# Patient Record
Sex: Female | Born: 1965 | Race: White | Hispanic: No | Marital: Single | Smoking: Current every day smoker
Health system: Southern US, Community
[De-identification: ages and names within clinical notes are randomized; demographics above are authoritative.]

## PROBLEM LIST (undated history)

## (undated) HISTORY — PX: ABDOMINAL HYSTERECTOMY: SHX81

## (undated) HISTORY — PX: DILATION AND CURETTAGE OF UTERUS: SHX78

## (undated) HISTORY — PX: UTERINE FIBROID SURGERY: SHX826

---

## 2013-06-08 DIAGNOSIS — K219 Gastro-esophageal reflux disease without esophagitis: Secondary | ICD-10-CM | POA: Insufficient documentation

## 2013-10-24 DIAGNOSIS — M545 Low back pain, unspecified: Secondary | ICD-10-CM | POA: Insufficient documentation

## 2016-04-21 ENCOUNTER — Emergency Department (HOSPITAL_COMMUNITY)
Admission: EM | Admit: 2016-04-21 | Discharge: 2016-04-21 | Disposition: A | Discharge status: Home / Self Care | Payer: Self-pay | Attending: Emergency Medicine | Admitting: Emergency Medicine

## 2016-04-21 ENCOUNTER — Emergency Department (HOSPITAL_COMMUNITY): Payer: Self-pay

## 2016-04-21 ENCOUNTER — Encounter (HOSPITAL_COMMUNITY): Payer: Self-pay | Admitting: Emergency Medicine

## 2016-04-21 DIAGNOSIS — R0789 Other chest pain: Secondary | ICD-10-CM | POA: Insufficient documentation

## 2016-04-21 DIAGNOSIS — F17210 Nicotine dependence, cigarettes, uncomplicated: Secondary | ICD-10-CM | POA: Insufficient documentation

## 2016-04-21 LAB — CBC
HEMATOCRIT: 41.9 % (ref 36.0–46.0)
HEMOGLOBIN: 14.8 g/dL (ref 12.0–15.0)
MCH: 28.8 pg (ref 26.0–34.0)
MCHC: 35.3 g/dL (ref 30.0–36.0)
MCV: 81.5 fL (ref 78.0–100.0)
Platelets: 324 10*3/uL (ref 150–400)
RBC: 5.14 MIL/uL — AB (ref 3.87–5.11)
RDW: 13.6 % (ref 11.5–15.5)
WBC: 8.5 10*3/uL (ref 4.0–10.5)

## 2016-04-21 LAB — BASIC METABOLIC PANEL
ANION GAP: 8 (ref 5–15)
BUN: 13 mg/dL (ref 6–20)
CALCIUM: 9.2 mg/dL (ref 8.9–10.3)
CO2: 24 mmol/L (ref 22–32)
Chloride: 108 mmol/L (ref 101–111)
Creatinine, Ser: 0.59 mg/dL (ref 0.44–1.00)
GLUCOSE: 113 mg/dL — AB (ref 65–99)
POTASSIUM: 3.7 mmol/L (ref 3.5–5.1)
SODIUM: 140 mmol/L (ref 135–145)

## 2016-04-21 LAB — I-STAT TROPONIN, ED: TROPONIN I, POC: 0 ng/mL (ref 0.00–0.08)

## 2016-04-21 LAB — TROPONIN I

## 2016-04-21 LAB — D-DIMER, QUANTITATIVE (NOT AT ARMC): D DIMER QUANT: 0.47 ug{FEU}/mL (ref 0.00–0.50)

## 2016-04-21 NOTE — ED Triage Notes (Signed)
Pt c/o palpitations x weeks, central chest pain and left arm pain onset last night. Chronic SOB worsened over past year.

## 2016-04-21 NOTE — ED Notes (Signed)
ED Provider at bedside. 

## 2016-04-21 NOTE — ED Provider Notes (Addendum)
[facility]-EMERGENCY DEPT Provider Note   CSN: [id number] Arrival date & time: 04/21/16  1351     History   Chief Complaint Chief Complaint  Patient presents with  . Palpitations  . Shortness of Breath  . Chest Pain    HPI Jody Williams is a 51 y.o. female.  51 year old female presents with several week history of palpitations as well as a 6 month history of dyspnea on exertion. Denies any orthopnea. No cough or congestion. No fever or chills. Palpitations are described as heart beating fast denies any irregularity to it. She has had some central chest discomfort which last for minutes to hours and is positional. No exertional component to this. Denies any lower extremity edema. Thought that some of this could be due to anxiety but she denies any symptoms at this time. No suicidal or homicidal ideations. Denies any excessive caffeine use. No recent history of blood loss. Nothing makes her symptoms better. No treatment use prior to arrival.      History reviewed. No pertinent past medical history.  There are no active problems to display for this patient.   Past Surgical History:  Procedure Laterality Date  . ABDOMINAL HYSTERECTOMY     left oopharectomy, right ovary intact  . CESAREAN SECTION    . DILATION AND CURETTAGE OF UTERUS    . UTERINE FIBROID SURGERY      OB History    No data available       Home Medications    Prior to Admission medications   Not on File    Family History History reviewed. No pertinent family history.  Social History Social History  Substance Use Topics  . Smoking status: Current Every Day Smoker    Packs/day: 0.50    Types: Cigarettes  . Smokeless tobacco: Never Used  . Alcohol use No     Comment: occasional     Allergies   Contrast media [iodinated diagnostic agents]   Review of Systems Review of Systems  All other systems reviewed and are negative.    Physical Exam Updated Vital Signs BP 121/71   Pulse 67   Temp  98.9 F (37.2 C) (Oral)   Resp 16   SpO2 98%   Physical Exam  Constitutional: She is oriented to person, place, and time. She appears well-developed and well-nourished.  Non-toxic appearance. No distress.  HENT:  Head: Normocephalic and atraumatic.  Eyes: Conjunctivae, EOM and lids are normal. Pupils are equal, round, and reactive to light.  Neck: Normal range of motion. Neck supple. No tracheal deviation present. No thyroid mass present.  Cardiovascular: Normal rate, regular rhythm and normal heart sounds.  Exam reveals no gallop.   No murmur heard. Pulmonary/Chest: Effort normal and breath sounds normal. No stridor. No respiratory distress. She has no decreased breath sounds. She has no wheezes. She has no rhonchi. She has no rales.  Abdominal: Soft. Normal appearance and bowel sounds are normal. She exhibits no distension. There is no tenderness. There is no rebound and no CVA tenderness.  Musculoskeletal: Normal range of motion. She exhibits no edema or tenderness.  Neurological: She is alert and oriented to person, place, and time. She has normal strength. No cranial nerve deficit or sensory deficit. GCS eye subscore is 4. GCS verbal subscore is 5. GCS motor subscore is 6.  Skin: Skin is warm and dry. No abrasion and no rash noted.  Psychiatric: She has a normal mood and affect. Her speech is normal and behavior is normal.  Nursing note and vitals reviewed.    ED Treatments / Results  Labs (all labs ordered are listed, but only abnormal results are displayed) Labs Reviewed  BASIC METABOLIC PANEL - Abnormal; Notable for the following:       Result Value   Glucose, Bld 113 (*)    All other components within normal limits  CBC - Abnormal; Notable for the following:    RBC 5.14 (*)    All other components within normal limits  D-DIMER, QUANTITATIVE (NOT AT [facility])  TROPONIN I  I-STAT TROPOININ, ED    EKG  EKG Interpretation  Date/Time:  Monday April 21 2016 14:03:43  EDT Ventricular Rate:  81 PR Interval:    QRS Duration: 97 QT Interval:  382 QTC Calculation: 444 R Axis:   76 Text Interpretation:  Sinus rhythm RSR' in V1 or V2, right VCD or RVH Confirmed by Freida Busman  MD, [place] Kitner ([id number]) on 04/21/2016 6:43:45 PM       Radiology Dg Chest 2 View  Result Date: 04/21/2016 CLINICAL DATA:  Palpitation for several weeks. EXAM: CHEST  2 VIEW COMPARISON:  None. FINDINGS: The heart size and mediastinal contours are within normal limits. There is no focal infiltrate, pulmonary edema, or pleural effusion. The visualized skeletal structures are unremarkable. IMPRESSION: No active cardiopulmonary disease. Electronically Signed   By: Sherian Rein M.D.   On: 04/21/2016 14:47    Procedures Procedures (including critical care time)  Medications Ordered in ED Medications - No data to display   Initial Impression / Assessment and Plan / ED Course  I have reviewed the triage vital signs and the nursing notes.  Pertinent labs & imaging results that were available during my care of the patient were reviewed by me and considered in my medical decision making (see chart for details).     Patient with negative d-dimer as well as delta troponin that was negative. Symptoms are better for some time now. Pain is worse with palpation to her chest wall. Do not think this represents ACS. Return precautions given.Patient's heart score is 2.  Final Clinical Impressions(s) / ED Diagnoses   Final diagnoses:  None    New Prescriptions New Prescriptions   No medications on file     Lorre Nick, MD 04/21/16 2144    Lorre Nick, MD 04/21/16 2145

## 2017-12-29 ENCOUNTER — Ambulatory Visit: Payer: Self-pay | Admitting: Nurse Practitioner

## 2017-12-29 ENCOUNTER — Ambulatory Visit: Payer: Self-pay

## 2017-12-29 VITALS — BP 112/68 | HR 87 | Temp 98.2°F | Resp 16

## 2017-12-29 DIAGNOSIS — F17200 Nicotine dependence, unspecified, uncomplicated: Secondary | ICD-10-CM

## 2017-12-29 DIAGNOSIS — H6501 Acute serous otitis media, right ear: Secondary | ICD-10-CM

## 2017-12-29 DIAGNOSIS — J329 Chronic sinusitis, unspecified: Secondary | ICD-10-CM

## 2017-12-29 MED ORDER — DOXYCYCLINE HYCLATE 100 MG PO TABS: 100.0000 mg | ORAL_TABLET | Freq: Two times a day (BID) | ORAL | 0 refills | Status: AC

## 2017-12-29 MED ORDER — FLUTICASONE PROPIONATE 50 MCG/ACT NA SUSP: NASAL | 6 refills | Status: AC

## 2017-12-29 NOTE — Patient Instructions (Signed)
Fluids, rest and keep hands washed Take meds as directed Return to clinic if symptoms persist or worsen

## 2017-12-29 NOTE — Progress Notes (Signed)
   Subjective:    Patient ID: Jody Williams, female    DOB: 1965/04/05, 52 y.o.   MRN: [id number]  HPI  Jody Williams comes [facility] the [facility] with c/o headache, upper teeth pain and facial pain x 1 week. Also reports right ear pain. Does report she has allergies and allergie to a lot of foods. She takes daily zyrtec and claritin. Has been rubbing vicks over temporal area and chest. Reports was seen at the end of Oct and treated for bronchitis; which she was put on abx and albuterol inhaler. Denies any wheezing, SOB, fever or chest pain.   Admits smoking 5/6 cigarettes per day and has been a smoker x 30 years which she's trying to "cut back"  Noted e AOB was signed by patient and with IT issues it isn't displaying in documents. Verbal consent to see and treat given during OV. Review of Systems  Constitutional: Negative for fatigue and fever.  HENT: Positive for ear pain, sinus pressure and sinus pain. Negative for congestion, ear discharge and facial swelling.   Respiratory: Negative for chest tightness, shortness of breath and wheezing.   Cardiovascular: Negative for chest pain.  Neurological: Positive for headaches.       Objective:   Physical Exam  Constitutional: She is oriented to person, place, and time. She appears well-developed and well-nourished.  HENT:  Head: Normocephalic.   Right ear with moderate yellow cerumen but TM appears erythematous. Left TM with moderate yellow cerumen and unable to fully visualize TM. Tenderness to maxillary and frontal sinus. Mildly injected pharynx  Neck: Normal range of motion.  Cardiovascular: Normal rate, regular rhythm and normal heart sounds.  Pulmonary/Chest: Effort normal and breath sounds normal. No respiratory distress.  Abdominal: Soft. Bowel sounds are normal.  Musculoskeletal: Normal range of motion.  Lymphadenopathy:    She has cervical adenopathy.  Neurological: She is alert and oriented to person, place, and time.  Skin:  Skin is warm and dry.  Psychiatric: She has a normal mood and affect.  Vitals reviewed.         Assessment & Plan:

## 2018-01-14 ENCOUNTER — Telehealth: Payer: Self-pay | Admitting: Nurse Practitioner

## 2018-01-14 NOTE — Telephone Encounter (Signed)
Patient left message on app noting she's not improved and almost completed doxycyline with the same symptoms that she had in office. Reports flonase is working but temporarily and has done treatment as directed. Denies fever, reported NKDA, note contrast dye on allergy list, Advised to continue flonase, routine claritin and called Augmentin 875/125 mg PO BID x 7 days #14 (start 01/09/18) [facility]. To follow up if no improvement. Verbalized understanding.

## 2018-04-19 IMAGING — CR DG CHEST 2V
2 series · 2 of 2 positions shown · non-contrast
Comparison: None.

CLINICAL DATA: Palpitation for several weeks.

EXAM:
CHEST  2 VIEW

[w chest pa]
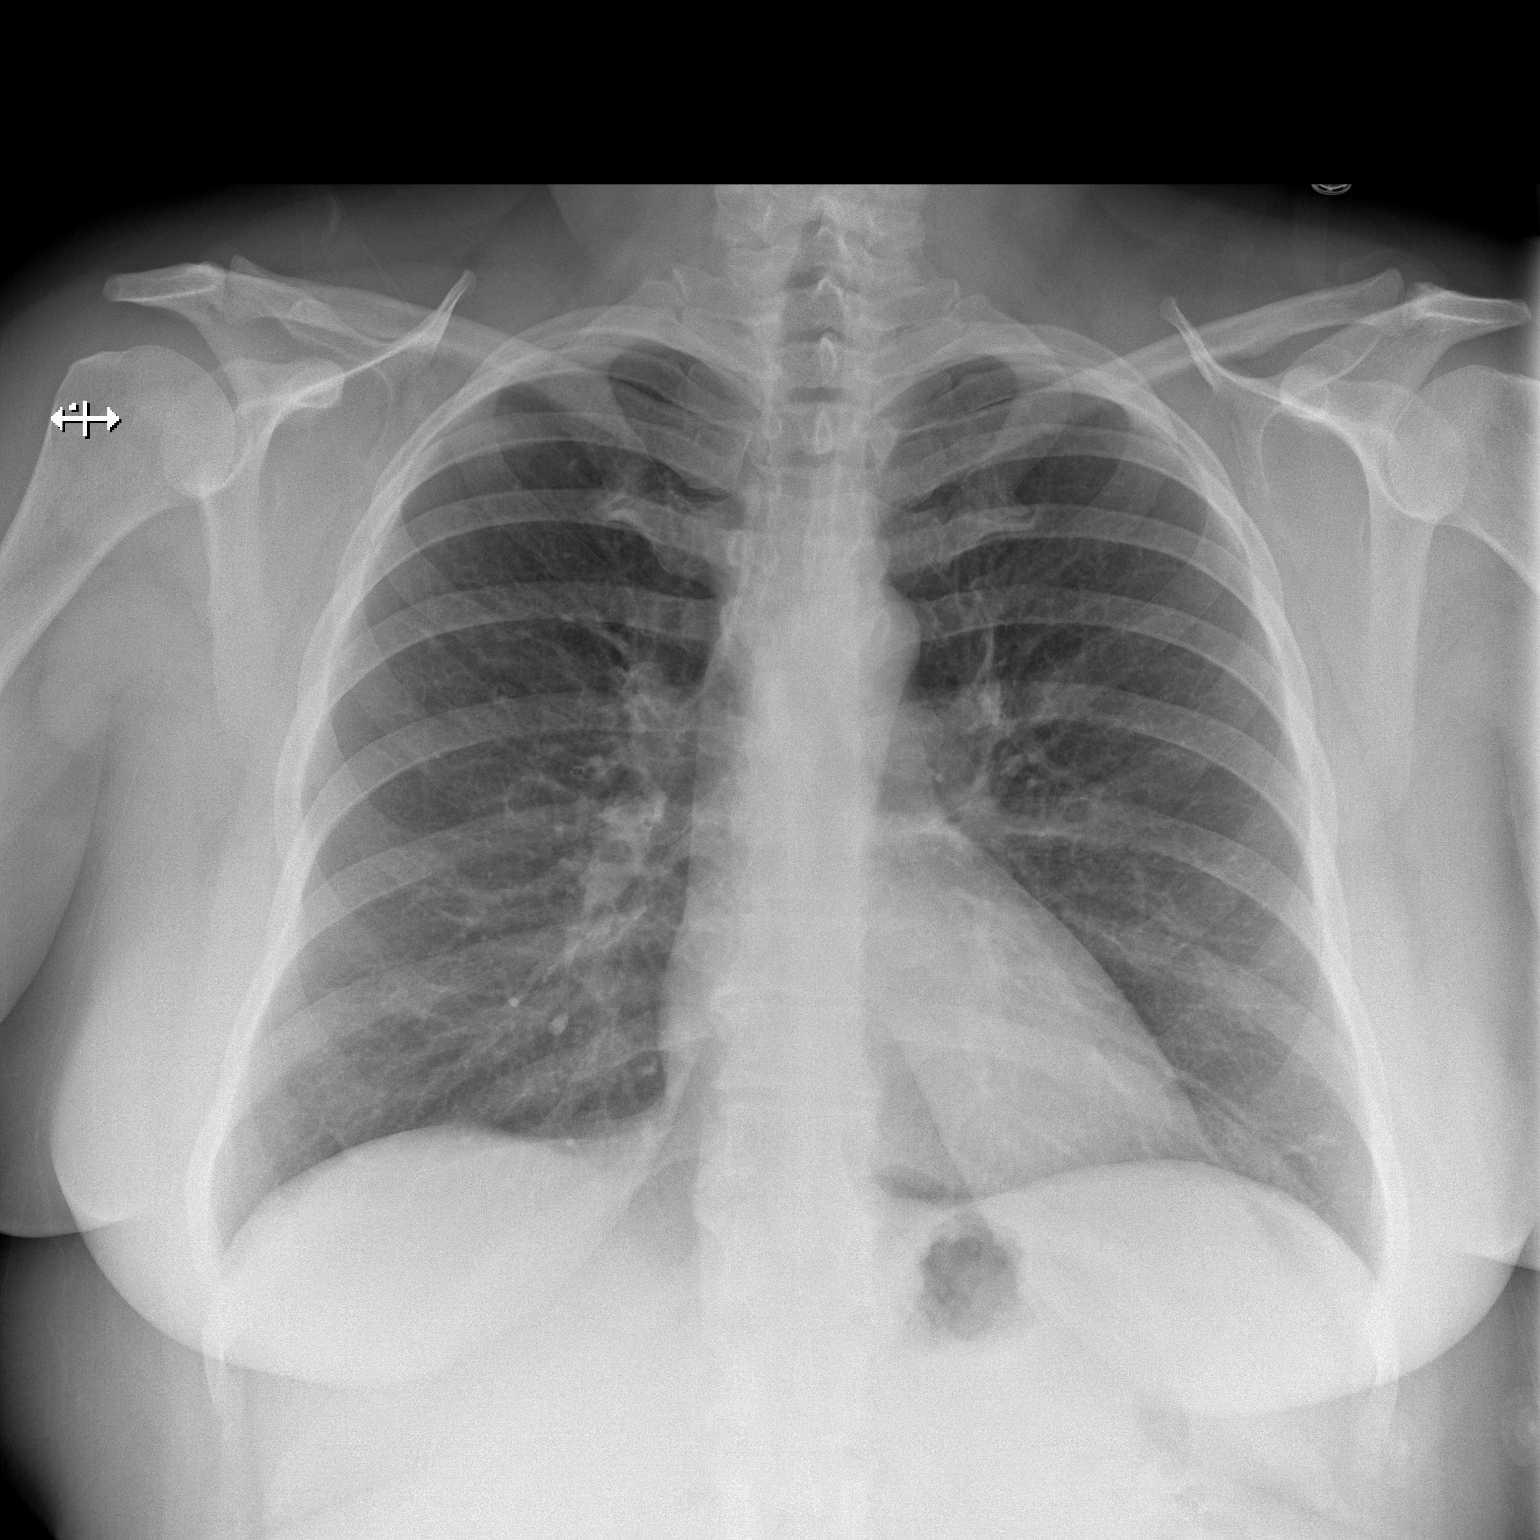

[w chest lat]
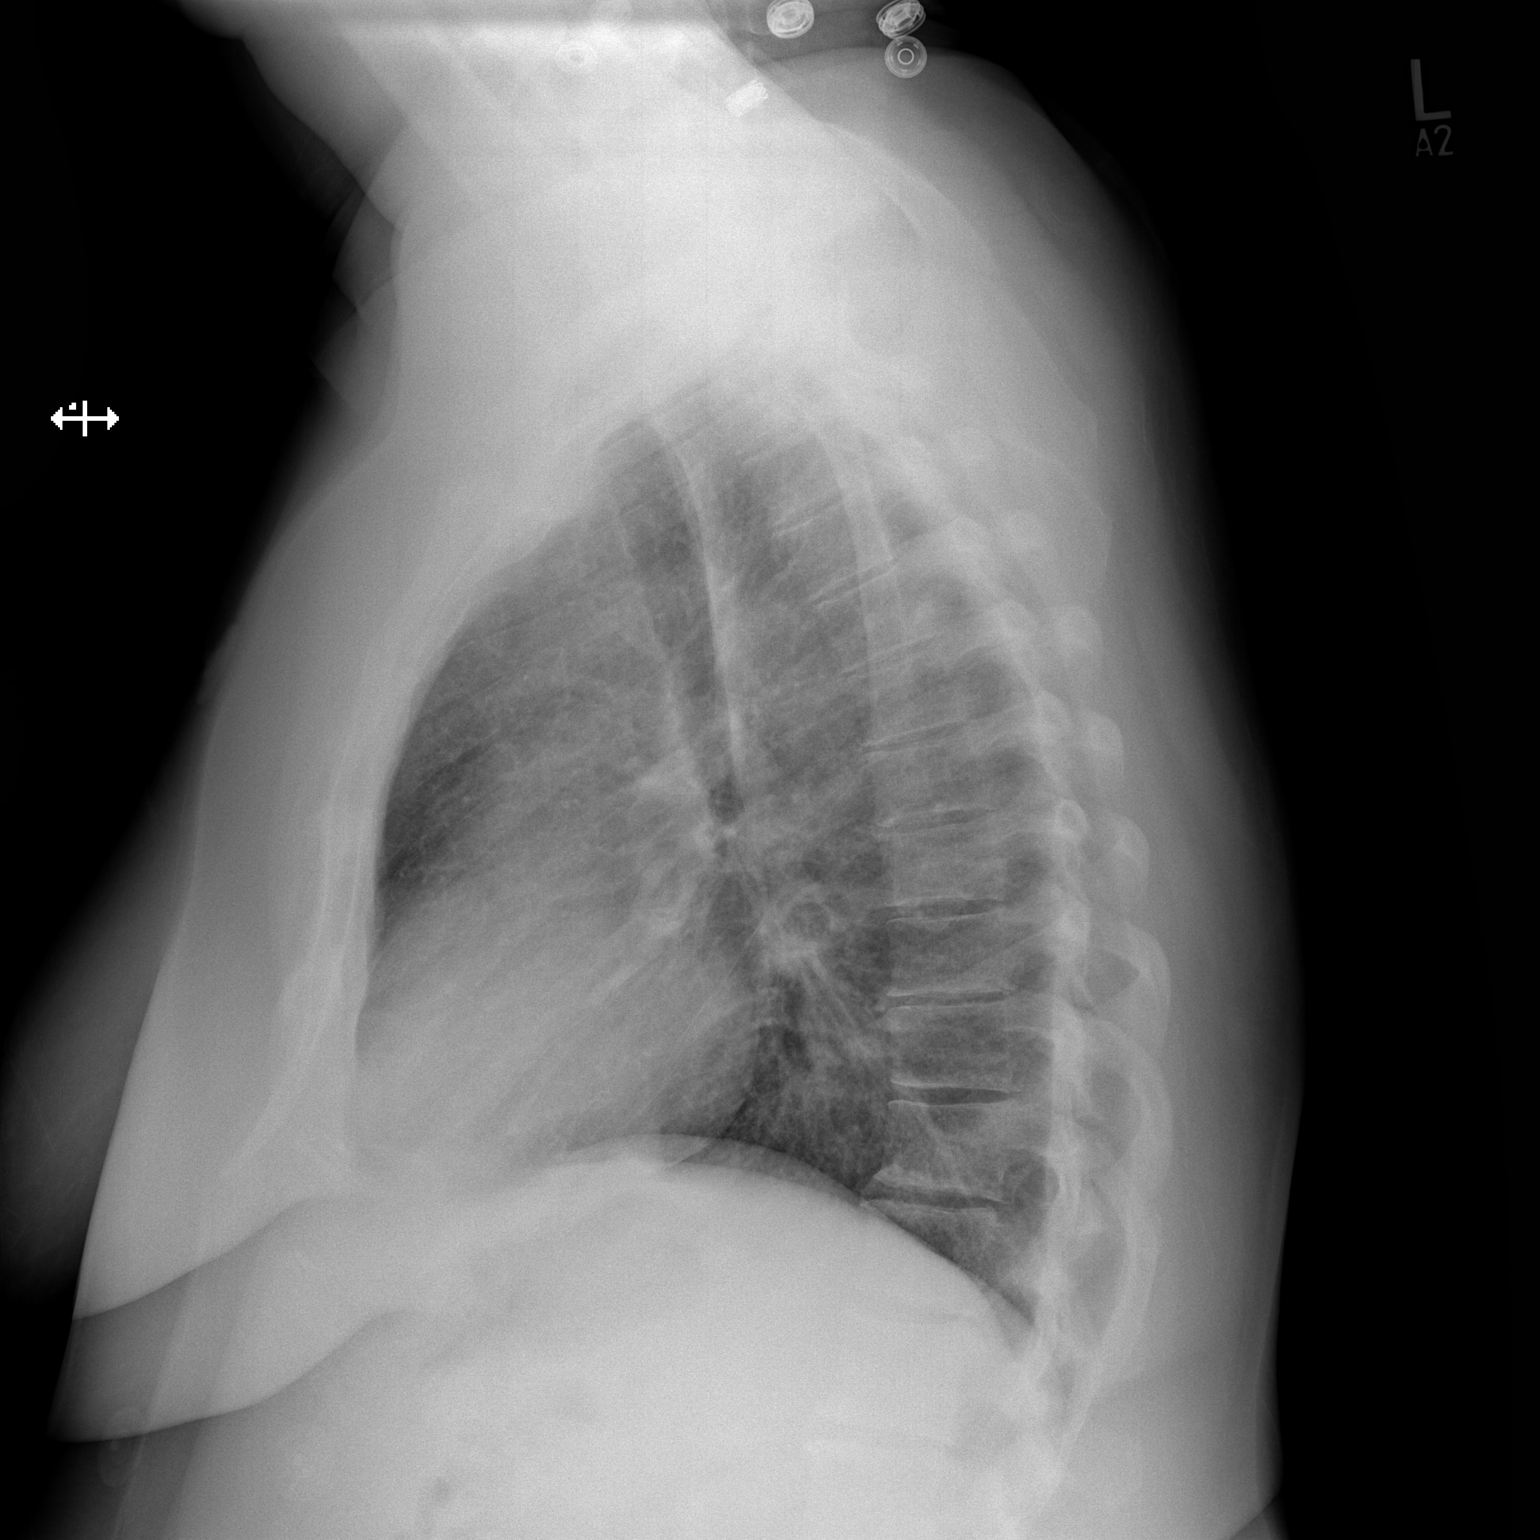

[2 of 2 positions shown; findings below may reference images not displayed]

FINDINGS: The heart size and mediastinal contours are within normal limits.
There is no focal infiltrate, pulmonary edema, or pleural effusion.
The visualized skeletal structures are unremarkable.
IMPRESSION: No active cardiopulmonary disease.
